# Patient Record
Sex: Female | Born: 1960 | Race: White | Hispanic: No | Smoking: Former smoker
Health system: Southern US, Community
[De-identification: ages and names within clinical notes are randomized; demographics above are authoritative.]

## PROBLEM LIST (undated history)

## (undated) DIAGNOSIS — N200 Calculus of kidney: Secondary | ICD-10-CM

## (undated) DIAGNOSIS — C50919 Malignant neoplasm of unspecified site of unspecified female breast: Secondary | ICD-10-CM

## (undated) HISTORY — PX: CHOLECYSTECTOMY: SHX55

## (undated) HISTORY — PX: MASTECTOMY: SHX3

## (undated) HISTORY — PX: BREAST SURGERY: SHX581

## (undated) HISTORY — PX: ABDOMINAL HYSTERECTOMY: SHX81

## (undated) HISTORY — PX: APPENDECTOMY: SHX54

---

## 2019-09-12 ENCOUNTER — Ambulatory Visit
Admission: EM | Admit: 2019-09-12 | Discharge: 2019-09-12 | Disposition: A | Discharge status: Home / Self Care | Payer: BC Managed Care – PPO | Attending: Emergency Medicine | Admitting: Emergency Medicine

## 2019-09-12 DIAGNOSIS — N10 Acute tubulo-interstitial nephritis: Secondary | ICD-10-CM | POA: Diagnosis not present

## 2019-09-12 HISTORY — DX: Calculus of kidney: N20.0

## 2019-09-12 LAB — POCT URINALYSIS DIP (MANUAL ENTRY)
Bilirubin, UA: NEGATIVE
Glucose, UA: NEGATIVE mg/dL
Ketones, POC UA: NEGATIVE mg/dL
Nitrite, UA: POSITIVE — AB
Protein Ur, POC: 100 mg/dL — AB
Spec Grav, UA: 1.015 (ref 1.010–1.025)
Urobilinogen, UA: 1 E.U./dL
pH, UA: 6.5 (ref 5.0–8.0)

## 2019-09-12 MED ORDER — PROMETHAZINE HCL 25 MG PO TABS
25.0000 mg | ORAL_TABLET | Freq: Four times a day (QID) | ORAL | 0 refills | Status: AC | PRN
Start: 2019-09-12 — End: 2019-09-15

## 2019-09-12 MED ORDER — CIPROFLOXACIN HCL 500 MG PO TABS
500.0000 mg | ORAL_TABLET | Freq: Two times a day (BID) | ORAL | 0 refills | Status: AC
Start: 2019-09-12 — End: 2019-09-19

## 2019-09-12 NOTE — ED Provider Notes (Signed)
[name]    CSN: [id number] Arrival date & time: 09/12/19  1746      History   Chief Complaint Chief Complaint  Patient presents with  . Flank Pain    HPI Wendy Walker is a 59 y.o. female history of kidney stones, pyelonephritis presenting for bilateral flank pain x2-2 days.  States this is preceded by lower abdominal pain which has persisted.  Patient also notes new fever since today.  Took ibuprofen with some relief.  Denies change in urinary stream.  No vomiting, pelvic or vaginal pain, vaginal discharge.    Past Medical History:  Diagnosis Date  . Kidney stone     There are no problems to display for this patient.   Past Surgical History:  Procedure Laterality Date  . ABDOMINAL HYSTERECTOMY    . APPENDECTOMY    . BREAST SURGERY    . CHOLECYSTECTOMY      OB History   No obstetric history on file.      Home Medications    Prior to Admission medications   Medication Sig Start Date End Date Taking? Authorizing Provider  ciprofloxacin (CIPRO) 500 MG tablet Take 1 tablet (500 mg total) by mouth every 12 (twelve) hours for 7 days. 09/12/19 09/19/19  Hall-Potvin, [place], PA-C  promethazine (PHENERGAN) 25 MG tablet Take 1 tablet (25 mg total) by mouth every 6 (six) hours as needed for up to 3 days for nausea or vomiting. 09/12/19 09/15/19  Hall-Potvin, [place], PA-C    Family History History reviewed. No pertinent family history.  Social History Social History   Tobacco Use  . Smoking status: Current Some Day Smoker  . Smokeless tobacco: Never Used  Substance Use Topics  . Alcohol use: Not Currently  . Drug use: Never     Allergies   Penicillins   Review of Systems As per HPI   Physical Exam Triage Vital Signs ED Triage Vitals  Enc Vitals Group     BP      Pulse      Resp      Temp      Temp src      SpO2      Weight      Height      Head Circumference      Peak Flow      Pain Score      Pain Loc      Pain Edu?    Excl. in [place]?    No data found.  Updated Vital Signs BP 106/64 (BP Location: Left Arm)   Pulse (!) 106   Temp (!) 101 F (38.3 C) (Oral)   Resp 18   SpO2 96%   Visual Acuity Right Eye Distance:   Left Eye Distance:   Bilateral Distance:    Right Eye Near:   Left Eye Near:    Bilateral Near:     Physical Exam Constitutional:      General: She is not in acute distress.    Appearance: She is not ill-appearing.  HENT:     Head: Normocephalic and atraumatic.  Eyes:     General: No scleral icterus.    Pupils: Pupils are equal, round, and reactive to light.  Cardiovascular:     Rate and Rhythm: Regular rhythm. Tachycardia present.  Pulmonary:     Effort: Pulmonary effort is normal. No respiratory distress.     Breath sounds: No wheezing.  Abdominal:     General: Bowel sounds are normal.  Palpations: Abdomen is soft. There is no mass.     Tenderness: There is abdominal tenderness. There is right CVA tenderness and left CVA tenderness. There is no guarding or rebound.     Comments: Mild lower abdominal TTP  Skin:    Coloration: Skin is not jaundiced or pale.  Neurological:     Mental Status: She is alert and oriented to person, place, and time.      UC Treatments / Results  Labs (all labs ordered are listed, but only abnormal results are displayed) Labs Reviewed  POCT URINALYSIS DIP (MANUAL ENTRY) - Abnormal; Notable for the following components:      Result Value   Clarity, UA cloudy (*)    Blood, UA large (*)    Protein Ur, POC =100 (*)    Nitrite, UA Positive (*)    Leukocytes, UA Large (3+) (*)    All other components within normal limits  URINE CULTURE    EKG   Radiology No results found.  Procedures Procedures (including critical care time)  Medications Ordered in UC Medications - No data to display  Initial Impression / Assessment and Plan / UC Course  I have reviewed the triage vital signs and the nursing notes.  Pertinent labs & imaging  results that were available during my care of the patient were reviewed by me and considered in my medical decision making (see chart for details).     Patient is febrile, nontoxic.  Urine dipstick significant for leukocytes, nitrates, protein, blood.  Patient does have bilateral CVA tenderness with history of [place] and renal calculi.  Start ciprofloxacin to cover for pyelonephritis given fever, new reviewed at length return precautions suggestive of obstructive renal stone.  Patient requesting Phenergan for nausea she has tolerated this well in the past: Sent.  Return precautions discussed, patient verbalized understanding and is agreeable to plan. Final Clinical Impressions(s) / UC Diagnoses   Final diagnoses:  Acute pyelonephritis     Discharge Instructions     Take antibiotic twice daily with food. Important to drink plenty of water throughout the day. May continue Azo as needed for burning sensation. Return for worsening urinary symptoms, blood in urine, abdominal or back pain, fever.    ED Prescriptions    Medication Sig Dispense Auth. Provider   ciprofloxacin (CIPRO) 500 MG tablet Take 1 tablet (500 mg total) by mouth every 12 (twelve) hours for 7 days. 14 tablet Hall-Potvin, [place], PA-C   promethazine (PHENERGAN) 25 MG tablet Take 1 tablet (25 mg total) by mouth every 6 (six) hours as needed for up to 3 days for nausea or vomiting. 12 tablet Hall-Potvin, [place], PA-C     PDMP not reviewed this encounter.   Hall-Potvin, [place], [place] 09/13/19 [phone]

## 2019-09-12 NOTE — Discharge Instructions (Signed)
Take antibiotic twice daily with food. Important to drink plenty of water throughout the day. May continue Azo as needed for burning sensation. Return for worsening urinary symptoms, blood in urine, abdominal or back pain, fever. 

## 2019-09-12 NOTE — ED Triage Notes (Signed)
Pt c/o bilateral flank pain radiating to lower abdomen x2-3 days, states now has a fever. Last ibuprofen at lunch time.

## 2019-09-15 LAB — URINE CULTURE: Culture: >=100000 — AB

## 2019-10-17 ENCOUNTER — Other Ambulatory Visit (HOSPITAL_COMMUNITY)
Admission: RE | Admit: 2019-10-17 | Discharge: 2019-10-17 | Disposition: A | Discharge status: Home / Self Care | Payer: BC Managed Care – PPO | Source: Ambulatory Visit | Attending: Family Medicine | Admitting: Family Medicine

## 2019-10-17 DIAGNOSIS — R0600 Dyspnea, unspecified: Secondary | ICD-10-CM | POA: Diagnosis not present

## 2019-10-17 DIAGNOSIS — Z124 Encounter for screening for malignant neoplasm of cervix: Secondary | ICD-10-CM | POA: Diagnosis not present

## 2019-10-17 DIAGNOSIS — K581 Irritable bowel syndrome with constipation: Secondary | ICD-10-CM | POA: Diagnosis not present

## 2019-10-17 DIAGNOSIS — N390 Urinary tract infection, site not specified: Secondary | ICD-10-CM | POA: Diagnosis not present

## 2019-10-17 DIAGNOSIS — Z0000 Encounter for general adult medical examination without abnormal findings: Secondary | ICD-10-CM | POA: Diagnosis not present

## 2019-10-17 DIAGNOSIS — Z13220 Encounter for screening for lipoid disorders: Secondary | ICD-10-CM | POA: Diagnosis not present

## 2019-10-17 DIAGNOSIS — R109 Unspecified abdominal pain: Secondary | ICD-10-CM | POA: Diagnosis not present

## 2019-10-19 LAB — CYTOLOGY - PAP
Comment: NEGATIVE
Diagnosis: NEGATIVE
High risk HPV: NEGATIVE

## 2019-10-25 ENCOUNTER — Other Ambulatory Visit: Payer: Self-pay | Admitting: Family Medicine

## 2019-10-25 DIAGNOSIS — Z1231 Encounter for screening mammogram for malignant neoplasm of breast: Secondary | ICD-10-CM

## 2019-11-02 ENCOUNTER — Ambulatory Visit: Payer: BC Managed Care – PPO

## 2019-11-03 ENCOUNTER — Encounter: Payer: Self-pay | Admitting: Pulmonary Disease

## 2019-11-04 DIAGNOSIS — N3001 Acute cystitis with hematuria: Secondary | ICD-10-CM | POA: Diagnosis not present

## 2019-11-04 DIAGNOSIS — R300 Dysuria: Secondary | ICD-10-CM | POA: Diagnosis not present

## 2019-11-10 ENCOUNTER — Other Ambulatory Visit: Payer: Self-pay

## 2019-11-10 ENCOUNTER — Ambulatory Visit
Admission: RE | Admit: 2019-11-10 | Discharge: 2019-11-10 | Disposition: A | Discharge status: Home / Self Care | Payer: BC Managed Care – PPO | Source: Ambulatory Visit | Attending: Family Medicine | Admitting: Family Medicine

## 2019-11-10 DIAGNOSIS — Z1231 Encounter for screening mammogram for malignant neoplasm of breast: Secondary | ICD-10-CM

## 2019-11-10 HISTORY — DX: Malignant neoplasm of unspecified site of unspecified female breast: C50.919

## 2019-11-16 ENCOUNTER — Other Ambulatory Visit: Payer: Self-pay | Admitting: Family Medicine

## 2019-11-16 DIAGNOSIS — R928 Other abnormal and inconclusive findings on diagnostic imaging of breast: Secondary | ICD-10-CM

## 2019-11-28 ENCOUNTER — Other Ambulatory Visit: Payer: Self-pay | Admitting: Family Medicine

## 2019-11-28 ENCOUNTER — Other Ambulatory Visit: Payer: Self-pay

## 2019-11-28 ENCOUNTER — Ambulatory Visit
Admission: RE | Admit: 2019-11-28 | Discharge: 2019-11-28 | Disposition: A | Discharge status: Home / Self Care | Payer: BC Managed Care – PPO | Source: Ambulatory Visit | Attending: Family Medicine | Admitting: Family Medicine

## 2019-11-28 DIAGNOSIS — R928 Other abnormal and inconclusive findings on diagnostic imaging of breast: Secondary | ICD-10-CM | POA: Diagnosis not present

## 2019-11-28 DIAGNOSIS — N6489 Other specified disorders of breast: Secondary | ICD-10-CM | POA: Diagnosis not present

## 2019-11-28 DIAGNOSIS — N6310 Unspecified lump in the right breast, unspecified quadrant: Secondary | ICD-10-CM

## 2019-11-28 DIAGNOSIS — Z853 Personal history of malignant neoplasm of breast: Secondary | ICD-10-CM | POA: Diagnosis not present

## 2019-12-05 ENCOUNTER — Ambulatory Visit
Admission: RE | Admit: 2019-12-05 | Discharge: 2019-12-05 | Disposition: A | Discharge status: Home / Self Care | Payer: BC Managed Care – PPO | Source: Ambulatory Visit | Attending: Family Medicine | Admitting: Family Medicine

## 2019-12-05 ENCOUNTER — Other Ambulatory Visit: Payer: Self-pay

## 2019-12-05 DIAGNOSIS — N6011 Diffuse cystic mastopathy of right breast: Secondary | ICD-10-CM | POA: Diagnosis not present

## 2019-12-05 DIAGNOSIS — N6311 Unspecified lump in the right breast, upper outer quadrant: Secondary | ICD-10-CM | POA: Diagnosis not present

## 2019-12-05 DIAGNOSIS — N6310 Unspecified lump in the right breast, unspecified quadrant: Secondary | ICD-10-CM

## 2019-12-14 ENCOUNTER — Ambulatory Visit (INDEPENDENT_AMBULATORY_CARE_PROVIDER_SITE_OTHER): Payer: BC Managed Care – PPO | Admitting: Pulmonary Disease

## 2019-12-14 ENCOUNTER — Other Ambulatory Visit: Payer: Self-pay

## 2019-12-14 ENCOUNTER — Encounter: Payer: Self-pay | Admitting: Pulmonary Disease

## 2019-12-14 VITALS — BP 116/70 | HR 71 | Temp 98.4°F | Ht <= 58 in | Wt 134.8 lb

## 2019-12-14 DIAGNOSIS — R05 Cough: Secondary | ICD-10-CM

## 2019-12-14 DIAGNOSIS — Z87891 Personal history of nicotine dependence: Secondary | ICD-10-CM | POA: Diagnosis not present

## 2019-12-14 DIAGNOSIS — R0602 Shortness of breath: Secondary | ICD-10-CM | POA: Diagnosis not present

## 2019-12-14 DIAGNOSIS — R059 Cough, unspecified: Secondary | ICD-10-CM

## 2019-12-14 MED ORDER — STIOLTO RESPIMAT 2.5-2.5 MCG/ACT IN AERS: 2.0000 | INHALATION_SPRAY | Freq: Every day | RESPIRATORY_TRACT | 6 refills | Status: AC

## 2019-12-14 MED ORDER — STIOLTO RESPIMAT 2.5-2.5 MCG/ACT IN AERS
2.0000 | INHALATION_SPRAY | Freq: Every day | RESPIRATORY_TRACT | 0 refills | Status: AC
Start: 2019-12-14 — End: ?
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
## 2019-12-14 NOTE — Patient Instructions (Addendum)
Thank you for visiting Dr. Valeta Harms at [facility]. Today we recommend the following: Orders Placed This Encounter  Procedures  . Pulmonary Function Test   Meds ordered this encounter  Medications  . Tiotropium Bromide-Olodaterol (STIOLTO RESPIMAT) 2.5-2.5 MCG/ACT AERS    Sig: Inhale 2 puffs into the lungs daily.    Dispense:  4 g    Refill:  6   Samples of stiolto if we have them.   Return in about 6 weeks (around 01/25/2020) for with APP.    Please do your part to reduce the spread of COVID-19.

## 2019-12-30 ENCOUNTER — Other Ambulatory Visit (HOSPITAL_COMMUNITY): Payer: BC Managed Care – PPO

## 2020-01-21 ENCOUNTER — Other Ambulatory Visit (HOSPITAL_COMMUNITY): Payer: BC Managed Care – PPO

## 2020-01-25 ENCOUNTER — Ambulatory Visit: Payer: BC Managed Care – PPO | Admitting: Primary Care

## 2020-02-13 DIAGNOSIS — Z01818 Encounter for other preprocedural examination: Secondary | ICD-10-CM | POA: Diagnosis not present

## 2020-02-13 DIAGNOSIS — Z1211 Encounter for screening for malignant neoplasm of colon: Secondary | ICD-10-CM | POA: Diagnosis not present

## 2020-03-29 DIAGNOSIS — Z1159 Encounter for screening for other viral diseases: Secondary | ICD-10-CM | POA: Diagnosis not present

## 2020-04-03 DIAGNOSIS — K5730 Diverticulosis of large intestine without perforation or abscess without bleeding: Secondary | ICD-10-CM | POA: Diagnosis not present

## 2020-04-03 DIAGNOSIS — D120 Benign neoplasm of cecum: Secondary | ICD-10-CM | POA: Diagnosis not present

## 2020-04-03 DIAGNOSIS — D124 Benign neoplasm of descending colon: Secondary | ICD-10-CM | POA: Diagnosis not present

## 2020-04-03 DIAGNOSIS — K648 Other hemorrhoids: Secondary | ICD-10-CM | POA: Diagnosis not present

## 2020-04-03 DIAGNOSIS — Z1211 Encounter for screening for malignant neoplasm of colon: Secondary | ICD-10-CM | POA: Diagnosis not present

## 2020-04-03 DIAGNOSIS — K635 Polyp of colon: Secondary | ICD-10-CM | POA: Diagnosis not present

## 2020-04-16 DIAGNOSIS — N3001 Acute cystitis with hematuria: Secondary | ICD-10-CM | POA: Diagnosis not present

## 2020-04-16 DIAGNOSIS — R300 Dysuria: Secondary | ICD-10-CM | POA: Diagnosis not present

## 2020-04-26 DIAGNOSIS — N3001 Acute cystitis with hematuria: Secondary | ICD-10-CM | POA: Diagnosis not present

## 2020-04-26 DIAGNOSIS — R3129 Other microscopic hematuria: Secondary | ICD-10-CM | POA: Diagnosis not present

## 2020-05-08 DIAGNOSIS — B349 Viral infection, unspecified: Secondary | ICD-10-CM | POA: Diagnosis not present

## 2020-05-09 DIAGNOSIS — Z20822 Contact with and (suspected) exposure to covid-19: Secondary | ICD-10-CM | POA: Diagnosis not present

## 2020-05-09 DIAGNOSIS — R509 Fever, unspecified: Secondary | ICD-10-CM | POA: Diagnosis not present

## 2020-05-09 DIAGNOSIS — R059 Cough, unspecified: Secondary | ICD-10-CM | POA: Diagnosis not present

## 2020-06-15 DIAGNOSIS — N3000 Acute cystitis without hematuria: Secondary | ICD-10-CM | POA: Diagnosis not present

## 2020-06-15 DIAGNOSIS — R300 Dysuria: Secondary | ICD-10-CM | POA: Diagnosis not present

## 2020-06-15 DIAGNOSIS — R3129 Other microscopic hematuria: Secondary | ICD-10-CM | POA: Diagnosis not present

## 2020-06-19 DIAGNOSIS — R3121 Asymptomatic microscopic hematuria: Secondary | ICD-10-CM | POA: Diagnosis not present

## 2020-06-19 DIAGNOSIS — R351 Nocturia: Secondary | ICD-10-CM | POA: Diagnosis not present

## 2020-06-19 DIAGNOSIS — N390 Urinary tract infection, site not specified: Secondary | ICD-10-CM | POA: Diagnosis not present

## 2020-06-19 DIAGNOSIS — R350 Frequency of micturition: Secondary | ICD-10-CM | POA: Diagnosis not present

## 2020-07-02 DIAGNOSIS — R3121 Asymptomatic microscopic hematuria: Secondary | ICD-10-CM | POA: Diagnosis not present

## 2020-07-09 DIAGNOSIS — R3121 Asymptomatic microscopic hematuria: Secondary | ICD-10-CM | POA: Diagnosis not present

## 2020-07-09 DIAGNOSIS — N200 Calculus of kidney: Secondary | ICD-10-CM | POA: Diagnosis not present

## 2020-10-18 DIAGNOSIS — Z0000 Encounter for general adult medical examination without abnormal findings: Secondary | ICD-10-CM | POA: Diagnosis not present

## 2020-10-18 DIAGNOSIS — N83209 Unspecified ovarian cyst, unspecified side: Secondary | ICD-10-CM | POA: Diagnosis not present

## 2020-10-18 DIAGNOSIS — Z13220 Encounter for screening for lipoid disorders: Secondary | ICD-10-CM | POA: Diagnosis not present

## 2020-10-18 DIAGNOSIS — K581 Irritable bowel syndrome with constipation: Secondary | ICD-10-CM | POA: Diagnosis not present

## 2020-10-18 DIAGNOSIS — J9801 Acute bronchospasm: Secondary | ICD-10-CM | POA: Diagnosis not present

## 2020-10-18 DIAGNOSIS — R928 Other abnormal and inconclusive findings on diagnostic imaging of breast: Secondary | ICD-10-CM | POA: Diagnosis not present

## 2020-10-22 ENCOUNTER — Telehealth: Payer: Self-pay | Admitting: Pulmonary Disease

## 2020-10-22 NOTE — Telephone Encounter (Signed)
ATC x1, left message to return call.

## 2020-10-23 ENCOUNTER — Other Ambulatory Visit: Payer: Self-pay | Admitting: Family Medicine

## 2020-10-23 DIAGNOSIS — N83209 Unspecified ovarian cyst, unspecified side: Secondary | ICD-10-CM

## 2020-10-23 NOTE — Telephone Encounter (Signed)
Spoke with the pt  She is wanting to switch providers from Dr Valeta Harms  She does not have a specific provider in mind  Is being evaluated for cough  Pt notified of protocol when changing providers Dr Valeta Harms- please advise if you are okay with this

## 2020-10-24 NOTE — Telephone Encounter (Signed)
Fine with me, 30 min slot and with all meds in hand using a trust but verify approach to confirm accurate Medication  Reconciliation The principal here is that until we are certain that the  patients are doing what we've asked, it makes no sense to ask them to do more.

## 2020-10-24 NOTE — Telephone Encounter (Signed)
Called and spoke with pt letting him know that Dr. Melvyn Novas was fine with [place] scheduling her an appt with him and also stated to pt the info about her bring meds with her to [place]. Pt has been scheduled a 83min new pt appt with MW. Nothing further needed.

## 2020-10-24 NOTE — Telephone Encounter (Signed)
Received msg back from Dr Valeta Harms:  Garner Nash, DO  Rosana Berger, CMA Caller: Unspecified (2 days ago, 12:57 PM) Sure. Fine with me  BLI   Dr Melvyn Novas- would you like to see the pt since her issue is chronic cough?

## 2020-10-25 ENCOUNTER — Other Ambulatory Visit: Payer: Self-pay | Admitting: Family Medicine

## 2020-10-25 DIAGNOSIS — Z09 Encounter for follow-up examination after completed treatment for conditions other than malignant neoplasm: Secondary | ICD-10-CM

## 2020-11-07 ENCOUNTER — Ambulatory Visit
Admission: RE | Admit: 2020-11-07 | Discharge: 2020-11-07 | Disposition: A | Discharge status: Home / Self Care | Payer: BC Managed Care – PPO | Source: Ambulatory Visit | Attending: Family Medicine | Admitting: Family Medicine

## 2020-11-07 ENCOUNTER — Other Ambulatory Visit: Payer: Self-pay

## 2020-11-07 DIAGNOSIS — Z90710 Acquired absence of both cervix and uterus: Secondary | ICD-10-CM | POA: Diagnosis not present

## 2020-11-07 DIAGNOSIS — N83292 Other ovarian cyst, left side: Secondary | ICD-10-CM | POA: Diagnosis not present

## 2020-11-07 DIAGNOSIS — N83209 Unspecified ovarian cyst, unspecified side: Secondary | ICD-10-CM

## 2020-11-14 ENCOUNTER — Other Ambulatory Visit: Payer: Self-pay | Admitting: Family Medicine

## 2020-11-14 DIAGNOSIS — N63 Unspecified lump in unspecified breast: Secondary | ICD-10-CM

## 2020-11-14 DIAGNOSIS — R921 Mammographic calcification found on diagnostic imaging of breast: Secondary | ICD-10-CM

## 2020-11-19 ENCOUNTER — Ambulatory Visit: Payer: BC Managed Care – PPO

## 2020-11-19 ENCOUNTER — Other Ambulatory Visit: Payer: Self-pay

## 2020-11-19 ENCOUNTER — Ambulatory Visit
Admission: RE | Admit: 2020-11-19 | Discharge: 2020-11-19 | Disposition: A | Discharge status: Home / Self Care | Payer: BC Managed Care – PPO | Source: Ambulatory Visit | Attending: Family Medicine | Admitting: Family Medicine

## 2020-11-19 DIAGNOSIS — R921 Mammographic calcification found on diagnostic imaging of breast: Secondary | ICD-10-CM

## 2020-11-19 DIAGNOSIS — N63 Unspecified lump in unspecified breast: Secondary | ICD-10-CM

## 2020-11-19 DIAGNOSIS — R922 Inconclusive mammogram: Secondary | ICD-10-CM | POA: Diagnosis not present

## 2020-12-05 ENCOUNTER — Ambulatory Visit: Payer: BC Managed Care – PPO | Admitting: Internal Medicine

## 2021-01-21 DIAGNOSIS — R399 Unspecified symptoms and signs involving the genitourinary system: Secondary | ICD-10-CM | POA: Diagnosis not present

## 2021-01-21 DIAGNOSIS — N3000 Acute cystitis without hematuria: Secondary | ICD-10-CM | POA: Diagnosis not present

## 2021-02-04 ENCOUNTER — Ambulatory Visit: Payer: BC Managed Care – PPO | Admitting: Internal Medicine

## 2021-02-04 NOTE — Progress Notes (Deleted)
   Wendy Walker, female    DOB: 1960-11-20,    MRN: [id number]   Brief patient profile:  60 yo ***  *** referred to pulmonary clinic 02/04/2021 by Dr  Windell Norfolk  re chronic cough       History of Present Illness  02/04/2021  Pulmonary/ 1st office eval/[name]  No chief complaint on file.    Dyspnea:  *** Cough: *** Sleep: *** SABA use:   Past Medical History:  Diagnosis Date   Breast cancer ([place])    Kidney stone     Outpatient Medications Prior to Visit  Medication Sig Dispense Refill   promethazine (PHENERGAN) 25 MG tablet Take 1 tablet (25 mg total) by mouth every 6 (six) hours as needed for up to 3 days for nausea or vomiting. 12 tablet 0   SYMBICORT 160-4.5 MCG/ACT inhaler SMARTSIG:2 Puff(s) By Mouth Twice Daily     Tiotropium Bromide-Olodaterol (STIOLTO RESPIMAT) 2.5-2.5 MCG/ACT AERS Inhale 2 puffs into the lungs daily. 4 g 6   Tiotropium Bromide-Olodaterol (STIOLTO RESPIMAT) 2.5-2.5 MCG/ACT AERS Inhale 2 puffs into the lungs daily. 4 g 0   VENTOLIN HFA 108 (90 Base) MCG/ACT inhaler SMARTSIG:1 Puff(s) By Mouth Every 4 Hours PRN     No facility-administered medications prior to visit.     Objective:     There were no vitals taken for this visit.         Assessment   No problem-specific Assessment & Plan notes found for this encounter.     Christinia Gully, MD 02/04/2021

## 2021-02-26 ENCOUNTER — Ambulatory Visit: Payer: BC Managed Care – PPO | Admitting: Internal Medicine

## 2021-02-26 NOTE — Progress Notes (Deleted)
   Wendy Walker, female    DOB: [birth year]  MRN: [id number]   Brief patient profile:  referred to pulmonary clinic 02/26/2021 by *** for ***        History of Present Illness  02/26/2021  Pulmonary/ 1st office eval/Rama Sorci  No chief complaint on file.    Dyspnea:  *** Cough: *** Sleep: *** SABA use:   Past Medical History:  Diagnosis Date   Breast cancer ([place])    Kidney stone     Outpatient Medications Prior to Visit  Medication Sig Dispense Refill   promethazine (PHENERGAN) 25 MG tablet Take 1 tablet (25 mg total) by mouth every 6 (six) hours as needed for up to 3 days for nausea or vomiting. 12 tablet 0   SYMBICORT 160-4.5 MCG/ACT inhaler SMARTSIG:2 Puff(s) By Mouth Twice Daily     Tiotropium Bromide-Olodaterol (STIOLTO RESPIMAT) 2.5-2.5 MCG/ACT AERS Inhale 2 puffs into the lungs daily. 4 g 6   Tiotropium Bromide-Olodaterol (STIOLTO RESPIMAT) 2.5-2.5 MCG/ACT AERS Inhale 2 puffs into the lungs daily. 4 g 0   VENTOLIN HFA 108 (90 Base) MCG/ACT inhaler SMARTSIG:1 Puff(s) By Mouth Every 4 Hours PRN     No facility-administered medications prior to visit.     Objective:     There were no vitals taken for this visit.         Assessment   No problem-specific Assessment & Plan notes found for this encounter.     Christinia Gully, MD 02/26/2021

## 2021-03-25 DIAGNOSIS — M25511 Pain in right shoulder: Secondary | ICD-10-CM | POA: Diagnosis not present

## 2021-09-14 IMAGING — MG MM DIGITAL DIAGNOSTIC UNILAT*R* W/ TOMO W/ CAD
8 series · 8 of 20 positions shown · non-contrast
Comparison: Previous exam(s).

CLINICAL DATA: Screening recall for a mass with calcifications in
the right breast. The patient has history of a left breast
mastectomy for cancer in 7844.

EXAM:
DIGITAL DIAGNOSTIC UNILATERAL RIGHT MAMMOGRAM WITH TOMO AND CAD;
ULTRASOUND RIGHT BREAST LIMITED

[R CC]
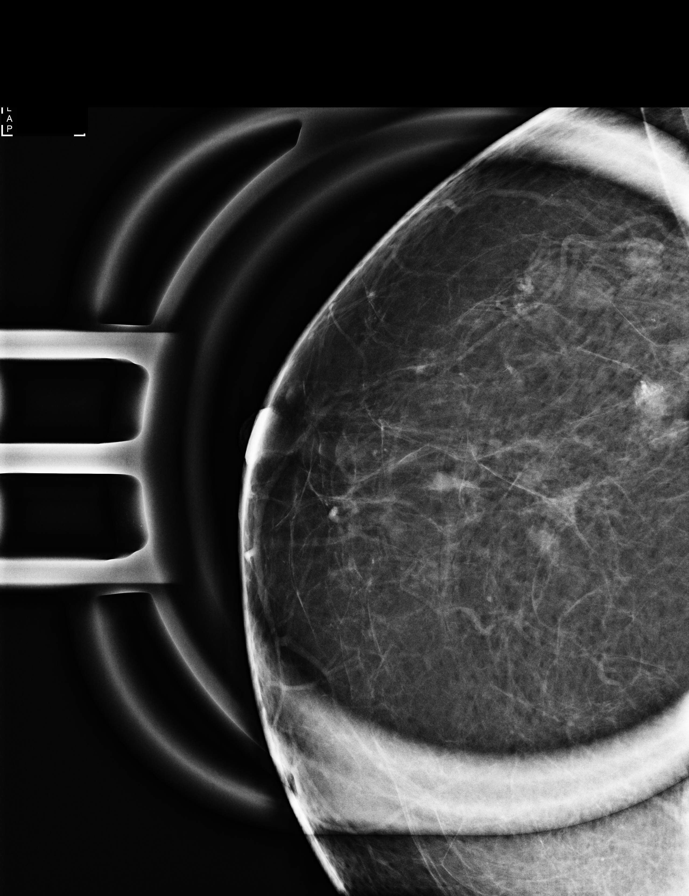

[R ML]
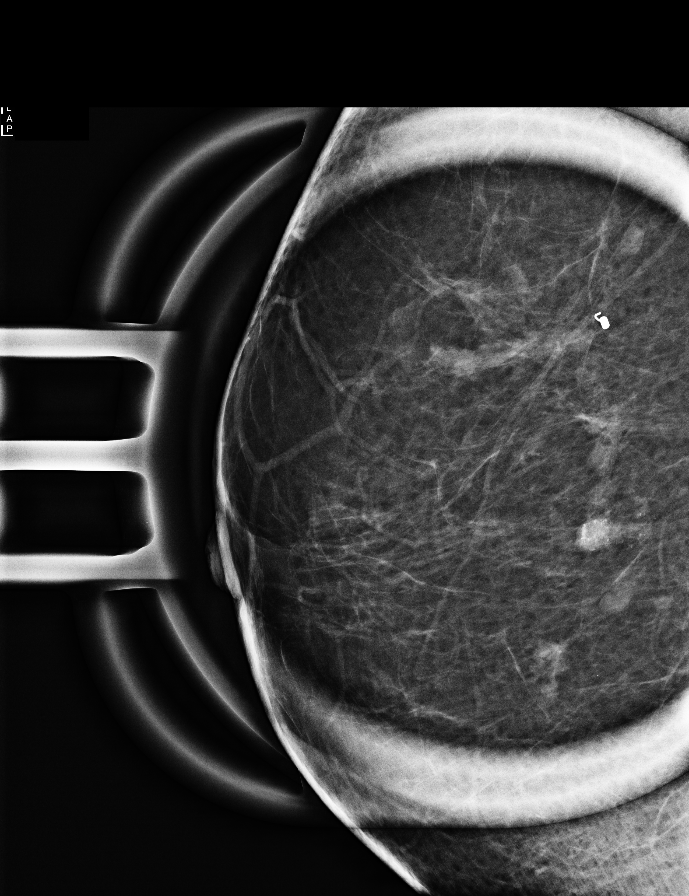

[R ML synth-2D]
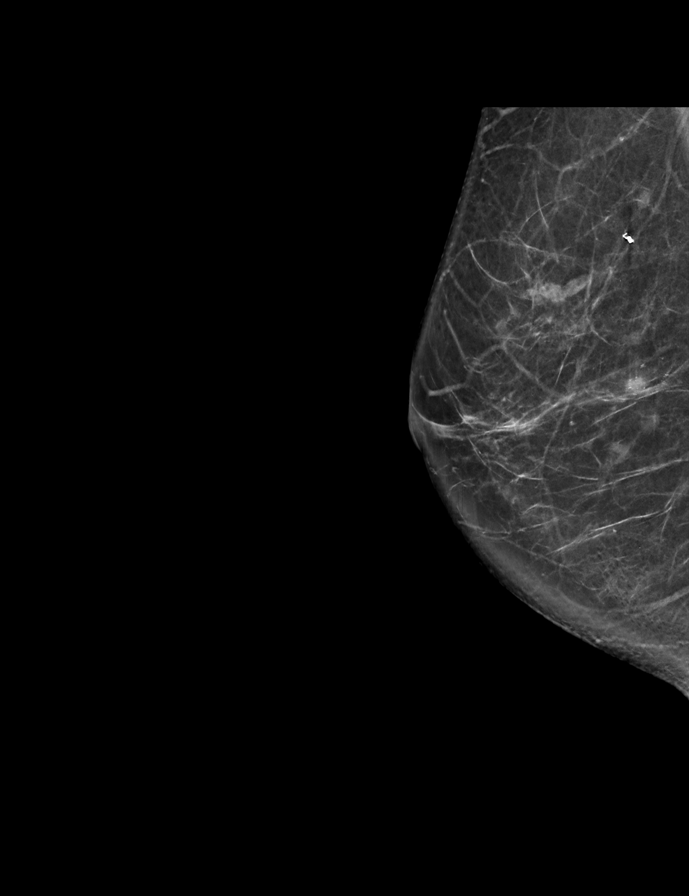

[R CC synth-2D]
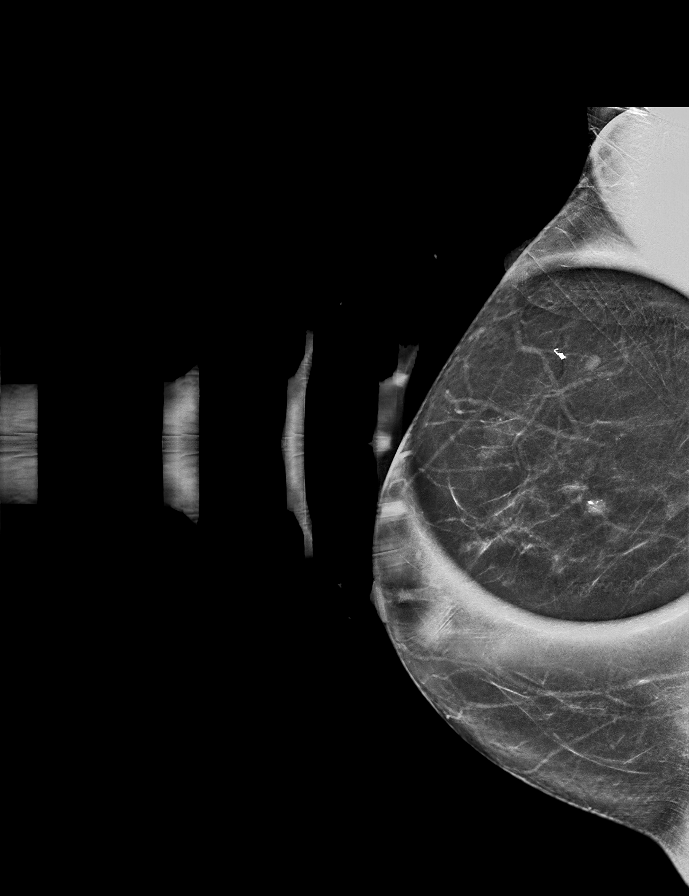

[R MLO synth-2D]
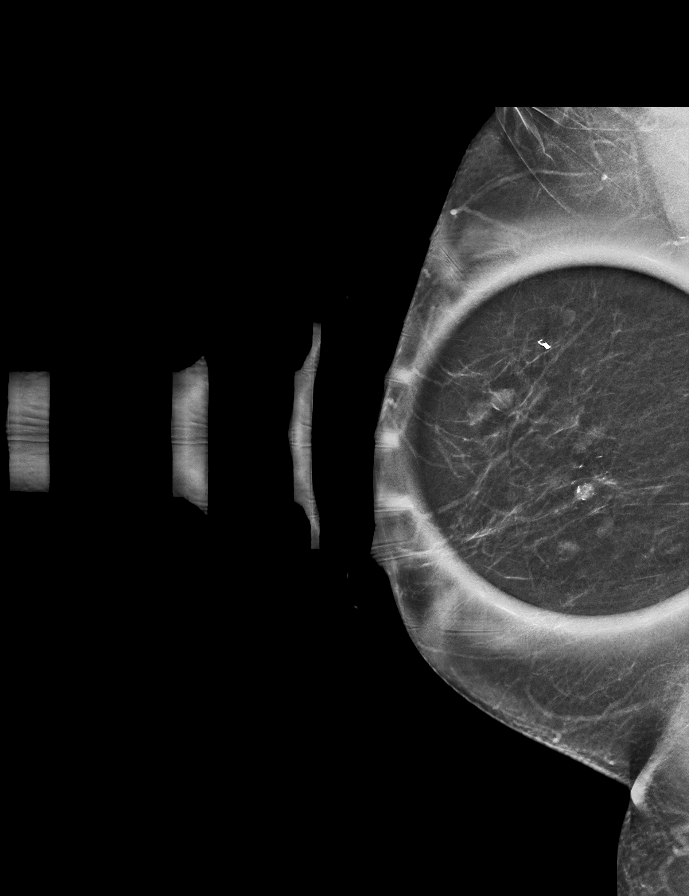

[R ML tomo · tomo slice 37/73.0]
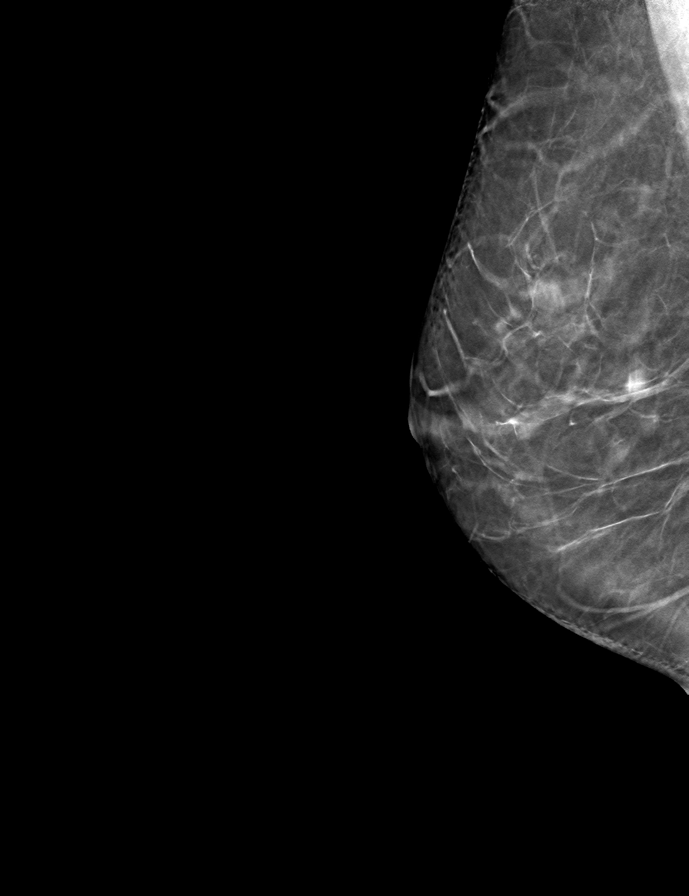

[R MLO tomo · tomo slice 29/56.0]
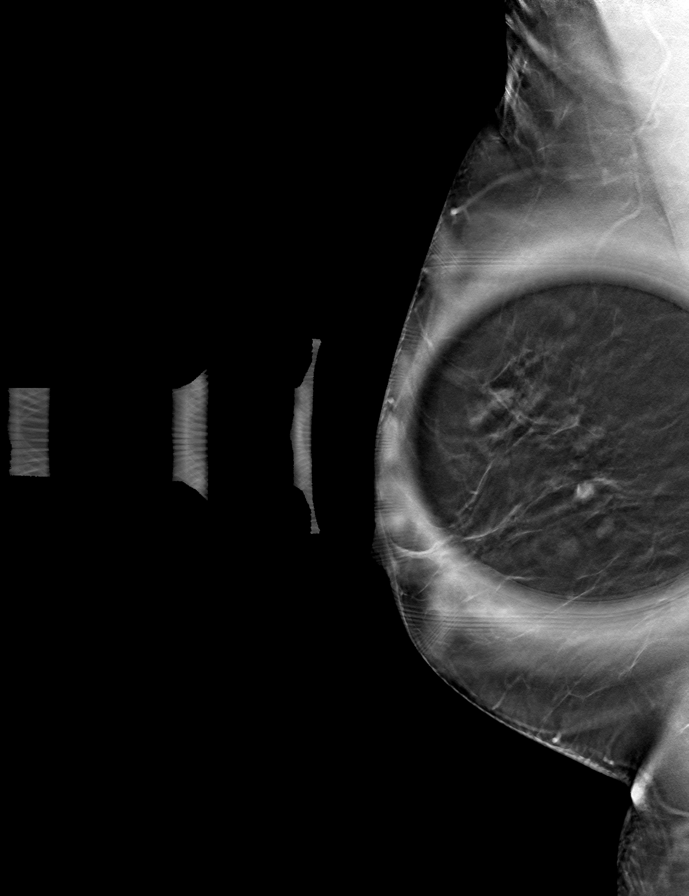

[R CC tomo · tomo slice 33/64.0]
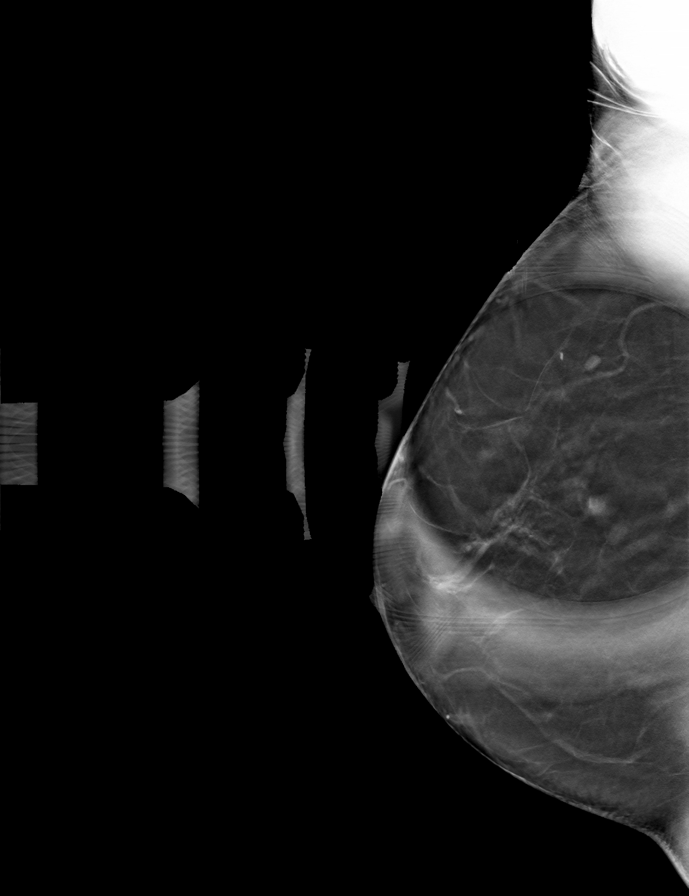

[8 of 20 positions shown; findings below may reference images not displayed]

ACR Breast Density Category b: There are scattered areas of
fibroglandular density.
FINDINGS: In the lateral to posterior right breast, there is an oval mass with
indistinct margins measuring 5 mm. There are amorphous
calcifications within the mass as well as posterior to the mass
which on clues it measures 1.2 cm.

Mammographic images were processed with CAD.

Ultrasound targeted to the right breast at 9 o'clock, 2 cm from the
nipple demonstrates an oval hypoechoic mass with circumscribed and
indistinct margins. The mass measures 0.6 x 0.4 x 0.5 cm. No blood
flow seen within the mass on color Doppler imaging. No suspicious
lymph nodes are identified in the right axilla.
IMPRESSION: 1. There is an indeterminate 0.6 cm right breast mass with
associated calcifications within and surrounding mass. Altogether
the abnormality measures 1.2 cm.

2.  No evidence of right axillary lymphadenopathy.

RECOMMENDATION:
Ultrasound guided biopsy is recommended for the right breast mass.
This has been scheduled for 12/05/2019 at [DATE] p.m.

I have discussed the findings and recommendations with the patient.
If applicable, a reminder letter will be sent to the patient
regarding the next appointment.

BI-RADS CATEGORY  4: Suspicious.
